# Patient Record
Sex: Female | Born: 1990
Health system: Southern US, Community
[De-identification: ages and names within clinical notes are randomized; demographics above are authoritative.]

## PROBLEM LIST (undated history)

## (undated) DIAGNOSIS — F419 Anxiety disorder, unspecified: Secondary | ICD-10-CM

## (undated) DIAGNOSIS — T7840XA Allergy, unspecified, initial encounter: Secondary | ICD-10-CM

## (undated) DIAGNOSIS — F32A Depression, unspecified: Secondary | ICD-10-CM

## (undated) HISTORY — DX: Depression, unspecified: F32.A

## (undated) HISTORY — DX: Anxiety disorder, unspecified: F41.9

## (undated) HISTORY — DX: Allergy, unspecified, initial encounter: T78.40XA

---

## 2011-05-02 ENCOUNTER — Encounter: Payer: Self-pay | Admitting: *Deleted

## 2011-05-02 ENCOUNTER — Emergency Department (HOSPITAL_COMMUNITY)
Admission: EM | Admit: 2011-05-02 | Discharge: 2011-05-02 | Disposition: A | Discharge status: Home / Self Care | Payer: Private Health Insurance - Indemnity | Attending: Emergency Medicine | Admitting: Emergency Medicine

## 2011-05-02 ENCOUNTER — Emergency Department (HOSPITAL_COMMUNITY): Payer: Private Health Insurance - Indemnity

## 2011-05-02 DIAGNOSIS — S0120XA Unspecified open wound of nose, initial encounter: Secondary | ICD-10-CM | POA: Insufficient documentation

## 2011-05-02 DIAGNOSIS — R51 Headache: Secondary | ICD-10-CM | POA: Insufficient documentation

## 2011-05-02 DIAGNOSIS — S0121XA Laceration without foreign body of nose, initial encounter: Secondary | ICD-10-CM

## 2011-05-02 DIAGNOSIS — Y92410 Unspecified street and highway as the place of occurrence of the external cause: Secondary | ICD-10-CM | POA: Insufficient documentation

## 2011-05-02 DIAGNOSIS — R040 Epistaxis: Secondary | ICD-10-CM | POA: Insufficient documentation

## 2011-05-02 MED ORDER — HYDROCODONE-ACETAMINOPHEN 5-325 MG PO TABS: 1.0000 | ORAL_TABLET | ORAL | Status: AC | PRN
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
## 2011-05-02 NOTE — ED Notes (Signed)
Discharged home with parents
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
## 2013-12-27 ENCOUNTER — Other Ambulatory Visit: Payer: Self-pay | Admitting: Family Medicine

## 2013-12-27 ENCOUNTER — Other Ambulatory Visit (HOSPITAL_COMMUNITY)
Admission: RE | Admit: 2013-12-27 | Discharge: 2013-12-27 | Disposition: A | Discharge status: Home / Self Care | Payer: Private Health Insurance - Indemnity | Source: Ambulatory Visit | Attending: Family Medicine | Admitting: Family Medicine

## 2013-12-27 DIAGNOSIS — Z0000 Encounter for general adult medical examination without abnormal findings: Secondary | ICD-10-CM | POA: Diagnosis present

## 2013-12-28 LAB — CYTOLOGY - PAP

## 2016-02-19 ENCOUNTER — Ambulatory Visit (HOSPITAL_COMMUNITY): Admission: RE | Admit: 2016-02-19 | Payer: Self-pay | Source: Home / Self Care | Admitting: Psychiatry

## 2016-07-17 DIAGNOSIS — Z79899 Other long term (current) drug therapy: Secondary | ICD-10-CM | POA: Diagnosis not present

## 2016-07-17 DIAGNOSIS — F3181 Bipolar II disorder: Secondary | ICD-10-CM | POA: Diagnosis not present

## 2016-08-18 DIAGNOSIS — H16223 Keratoconjunctivitis sicca, not specified as Sjogren's, bilateral: Secondary | ICD-10-CM | POA: Diagnosis not present

## 2016-12-21 ENCOUNTER — Emergency Department (HOSPITAL_COMMUNITY): Payer: Private Health Insurance - Indemnity

## 2016-12-21 ENCOUNTER — Emergency Department (HOSPITAL_COMMUNITY)
Admission: EM | Admit: 2016-12-21 | Discharge: 2016-12-21 | Disposition: A | Discharge status: Home / Self Care | Payer: Private Health Insurance - Indemnity | Attending: Emergency Medicine | Admitting: Emergency Medicine

## 2016-12-21 ENCOUNTER — Encounter (HOSPITAL_COMMUNITY): Payer: Self-pay | Admitting: Emergency Medicine

## 2016-12-21 DIAGNOSIS — S43402A Unspecified sprain of left shoulder joint, initial encounter: Secondary | ICD-10-CM | POA: Insufficient documentation

## 2016-12-21 DIAGNOSIS — Y990 Civilian activity done for income or pay: Secondary | ICD-10-CM | POA: Insufficient documentation

## 2016-12-21 DIAGNOSIS — Y93E1 Activity, personal bathing and showering: Secondary | ICD-10-CM | POA: Insufficient documentation

## 2016-12-21 DIAGNOSIS — Y929 Unspecified place or not applicable: Secondary | ICD-10-CM | POA: Insufficient documentation

## 2016-12-21 DIAGNOSIS — W182XXA Fall in (into) shower or empty bathtub, initial encounter: Secondary | ICD-10-CM | POA: Insufficient documentation

## 2016-12-21 DIAGNOSIS — Z79899 Other long term (current) drug therapy: Secondary | ICD-10-CM | POA: Insufficient documentation

## 2016-12-21 MED ORDER — FENTANYL CITRATE (PF) 100 MCG/2ML IJ SOLN
50.0000 ug | INTRAMUSCULAR | Status: DC | PRN
  Administered 2016-12-21: 50 ug via NASAL
  Filled 2016-12-21: qty 2

## 2016-12-21 NOTE — ED Provider Notes (Signed)
[facility]-EMERGENCY DEPT Provider Note   CSN: [id number] Arrival date & time: 12/21/16  1122     History   Chief Complaint Chief Complaint  Patient presents with  . Shoulder Injury    HPI Carolyn Ramirez is a 26 y.o. female.  25yo F w/ L shoulder pain after a fall. Earlier today, she slipped while in the shower and fell, striking her right arm and both knees. She reports that she only has mild pain in these areas but she has had left shoulder pain since the fall which is what brought her to the ED. She denies any head injury or loss of consciousness. No vomiting, visual changes, or problems walking. She is able to move her wrist and elbow, pain is mostly when she tries to raise her left arm. No numbness or tingling of her hands.   The history is provided by the patient.  Shoulder Injury     History reviewed. No pertinent past medical history.  There are no active problems to display for this patient.   History reviewed. No pertinent surgical history.  OB History    No data available       Home Medications    Prior to Admission medications   Medication Sig Start Date End Date Taking? Authorizing Provider  lamoTRIgine (LAMICTAL) 200 MG tablet Take 200 mg by mouth daily.     [provider]  LORazepam (ATIVAN) 0.5 MG tablet Take 0.5 mg by mouth every 8 (eight) hours as needed. anxiety    [provider]    Family History History reviewed. No pertinent family history.  Social History Social History  Substance Use Topics  . Smoking status: Not on file  . Smokeless tobacco: Not on file  . Alcohol use Not on file     Allergies   Amoxicillin; Chlorine; and Penicillins   Review of Systems Review of Systems  Eyes: Negative for visual disturbance.  Gastrointestinal: Negative for vomiting.  Musculoskeletal: Positive for arthralgias. Negative for gait problem.  Neurological: Negative for syncope, light-headedness and numbness.     Physical  Exam Updated Vital Signs BP 121/79 (BP Location: Right Arm)   Pulse 74   Temp 98.7 F (37.1 C) (Oral)   Resp 12   Ht 5\' 4"  (1.626 m)   Wt 110.7 kg (244 lb)   SpO2 96%   BMI 41.88 kg/m   Physical Exam  Constitutional: She is oriented to person, place, and time. She appears well-developed and well-nourished. No distress.  HENT:  Head: Normocephalic and atraumatic.  Eyes: Conjunctivae are normal.  Neck: Neck supple.  Cardiovascular: Intact distal pulses.   Musculoskeletal: She exhibits tenderness. She exhibits no deformity.  Normal ROM at left wrist and elbow, unable to abduct L shoulder 2/2 pain; tenderness of L lateral shoulder over deltoid muscle without obvious deformity; no tenderness of clavicle; no asymmetry compared to R shoulder  Neurological: She is alert and oriented to person, place, and time. No sensory deficit.  Skin: Skin is warm and dry.  No ecchymoses  Psychiatric: She has a normal mood and affect. Judgment normal.  Nursing note and vitals reviewed.    ED Treatments / Results  Labs (all labs ordered are listed, but only abnormal results are displayed) Labs Reviewed - No data to display  EKG  EKG Interpretation None       Radiology Dg Shoulder Left  Result Date: 12/21/2016 CLINICAL DATA:  26 year old female with shoulder pain post slipping in shower. Initial encounter. EXAM: LEFT  SHOULDER - 2+ VIEW COMPARISON:  None. FINDINGS: There is no evidence of fracture or dislocation. There is no evidence of arthropathy or other focal bone abnormality. Soft tissues are unremarkable. IMPRESSION: Negative. Electronically Signed   By: Lacy DuverneySteven  Olson M.D.   On: 12/21/2016 13:26    Procedures Procedures (including critical care time)  Medications Ordered in ED Medications  fentaNYL (SUBLIMAZE) injection 50 mcg (50 mcg Nasal Given 12/21/16 1242)     Initial Impression / Assessment and Plan / ED Course  I have reviewed the triage vital signs and the nursing  notes.  Pertinent imaging results that were available during my care of the patient were reviewed by me and considered in my medical decision making (see chart for details).    PT w/ L shoulder pain after a fall. Neurovascularly intact, no other injuries or complaints. Tender over deltoid muscle with no deformity or clavicular pain. XR negative. Discussed supportive measures including ice, NSAIDS, early ROM exercises. Provided with orthopedics follow-up information if her symptoms do not improve with supportive measures. Patient voiced understanding and was discharged in satisfactory condition.  Final Clinical Impressions(s) / ED Diagnoses   Final diagnoses:  Sprain of left shoulder, unspecified shoulder sprain type, initial encounter    New Prescriptions New Prescriptions   No medications on file     Saladin Petrelli, Ambrose Finlandachel Morgan, MD 12/21/16 1622

## 2016-12-21 NOTE — ED Notes (Signed)
No sling per EDP

## 2016-12-21 NOTE — ED Notes (Signed)
Pt has been provided with an ice pack.

## 2016-12-21 NOTE — ED Triage Notes (Signed)
Pt c/o left shoulder pain onset today after slipping in bathtub and falling, landing on knees and right arm. No head injury or LOC. Left shoulder minimally higher and shortened compared to right. 2+ radial pulse. Point tenderness to left shoulder. No pain with palpation or ROM to right shoulder, bilateral wrist, elbow, clavicle, knee, ankle.

## 2017-04-12 DIAGNOSIS — H43393 Other vitreous opacities, bilateral: Secondary | ICD-10-CM | POA: Diagnosis not present

## 2017-05-19 DIAGNOSIS — R112 Nausea with vomiting, unspecified: Secondary | ICD-10-CM | POA: Diagnosis not present

## 2017-09-29 DIAGNOSIS — H16223 Keratoconjunctivitis sicca, not specified as Sjogren's, bilateral: Secondary | ICD-10-CM | POA: Diagnosis not present

## 2017-10-18 DIAGNOSIS — Q839 Congenital malformation of breast, unspecified: Secondary | ICD-10-CM | POA: Diagnosis not present

## 2017-10-18 DIAGNOSIS — N6459 Other signs and symptoms in breast: Secondary | ICD-10-CM | POA: Diagnosis not present

## 2017-12-21 DIAGNOSIS — Z01419 Encounter for gynecological examination (general) (routine) without abnormal findings: Secondary | ICD-10-CM | POA: Diagnosis not present

## 2017-12-31 LAB — HM PAP SMEAR: HM Pap smear: NEGATIVE

## 2018-06-30 DIAGNOSIS — Z23 Encounter for immunization: Secondary | ICD-10-CM | POA: Diagnosis not present

## 2019-02-10 DIAGNOSIS — Z01411 Encounter for gynecological examination (general) (routine) with abnormal findings: Secondary | ICD-10-CM | POA: Diagnosis not present

## 2019-02-10 DIAGNOSIS — N920 Excessive and frequent menstruation with regular cycle: Secondary | ICD-10-CM | POA: Diagnosis not present

## 2019-02-22 DIAGNOSIS — Z3202 Encounter for pregnancy test, result negative: Secondary | ICD-10-CM | POA: Diagnosis not present

## 2019-02-22 DIAGNOSIS — Z30430 Encounter for insertion of intrauterine contraceptive device: Secondary | ICD-10-CM | POA: Diagnosis not present

## 2019-02-25 ENCOUNTER — Encounter (HOSPITAL_COMMUNITY): Payer: Self-pay | Admitting: *Deleted

## 2019-02-25 ENCOUNTER — Other Ambulatory Visit: Payer: Self-pay

## 2019-02-25 ENCOUNTER — Emergency Department (HOSPITAL_COMMUNITY)
Admission: EM | Admit: 2019-02-25 | Discharge: 2019-02-26 | Disposition: A | Discharge status: Home / Self Care | Payer: BC Managed Care – PPO | Attending: Emergency Medicine | Admitting: Emergency Medicine

## 2019-02-25 DIAGNOSIS — R1031 Right lower quadrant pain: Secondary | ICD-10-CM | POA: Diagnosis not present

## 2019-02-25 DIAGNOSIS — Z975 Presence of (intrauterine) contraceptive device: Secondary | ICD-10-CM | POA: Diagnosis not present

## 2019-02-25 DIAGNOSIS — N3090 Cystitis, unspecified without hematuria: Secondary | ICD-10-CM | POA: Insufficient documentation

## 2019-02-25 DIAGNOSIS — R110 Nausea: Secondary | ICD-10-CM | POA: Diagnosis not present

## 2019-02-25 DIAGNOSIS — R109 Unspecified abdominal pain: Secondary | ICD-10-CM | POA: Diagnosis not present

## 2019-02-25 LAB — URINALYSIS, ROUTINE W REFLEX MICROSCOPIC
Bilirubin Urine: NEGATIVE
Glucose, UA: NEGATIVE mg/dL
Ketones, ur: NEGATIVE mg/dL
Nitrite: NEGATIVE
Protein, ur: NEGATIVE mg/dL
Specific Gravity, Urine: 1.01 (ref 1.005–1.030)
pH: 5 (ref 5.0–8.0)

## 2019-02-25 LAB — I-STAT BETA HCG BLOOD, ED (MC, WL, AP ONLY): I-stat hCG, quantitative: <5 m[IU]/mL (ref <5)

## 2019-02-25 LAB — CBC
HCT: 38.5 % (ref 36.0–46.0)
Hemoglobin: 12.8 g/dL (ref 12.0–15.0)
MCH: 31.4 pg (ref 26.0–34.0)
MCHC: 33.2 g/dL (ref 30.0–36.0)
MCV: 94.6 fL (ref 80.0–100.0)
Platelets: 342 10*3/uL (ref 150–400)
RBC: 4.07 MIL/uL (ref 3.87–5.11)
RDW: 12.9 % (ref 11.5–15.5)
WBC: 8.9 10*3/uL (ref 4.0–10.5)
nRBC: 0 % (ref 0.0–0.2)

## 2019-02-25 MED ORDER — SODIUM CHLORIDE 0.9% FLUSH
3.0000 mL | Freq: Once | INTRAVENOUS | Status: AC
  Administered 2019-02-26: 3 mL via INTRAVENOUS

## 2019-02-25 NOTE — ED Triage Notes (Signed)
Pt reporting right lower abdominal pain that started several hours ago, associated with nausea. No urinary or vaginal symptoms. Pt did have an IUD placed on Wednesday.

## 2019-02-26 ENCOUNTER — Emergency Department (HOSPITAL_COMMUNITY): Payer: BC Managed Care – PPO

## 2019-02-26 ENCOUNTER — Encounter (HOSPITAL_COMMUNITY): Payer: Self-pay

## 2019-02-26 LAB — COMPREHENSIVE METABOLIC PANEL
ALT: 19 U/L (ref 0–44)
AST: 19 U/L (ref 15–41)
Albumin: 4.3 g/dL (ref 3.5–5.0)
Alkaline Phosphatase: 59 U/L (ref 38–126)
Anion gap: 10 (ref 5–15)
BUN: 10 mg/dL (ref 6–20)
CO2: 21 mmol/L — ABNORMAL LOW (ref 22–32)
Calcium: 9.4 mg/dL (ref 8.9–10.3)
Chloride: 107 mmol/L (ref 98–111)
Creatinine, Ser: 0.77 mg/dL (ref 0.44–1.00)
GFR calc Af Amer: >60 mL/min (ref >60)
GFR calc non Af Amer: >60 mL/min (ref >60)
Glucose, Bld: 88 mg/dL (ref 70–99)
Potassium: 4 mmol/L (ref 3.5–5.1)
Sodium: 138 mmol/L (ref 135–145)
Total Bilirubin: 0.4 mg/dL (ref 0.3–1.2)
Total Protein: 7.5 g/dL (ref 6.5–8.1)

## 2019-02-26 LAB — LIPASE, BLOOD: Lipase: 27 U/L (ref 11–51)

## 2019-02-26 MED ORDER — ONDANSETRON 4 MG PO TBDP: 4.0000 mg | ORAL_TABLET | Freq: Three times a day (TID) | ORAL | 0 refills | Status: DC | PRN

## 2019-02-26 MED ORDER — SODIUM CHLORIDE (PF) 0.9 % IJ SOLN
INTRAMUSCULAR | Status: AC
  Filled 2019-02-26: qty 50

## 2019-02-26 MED ORDER — CIPROFLOXACIN HCL 500 MG PO TABS
500.0000 mg | ORAL_TABLET | Freq: Once | ORAL | Status: AC
  Administered 2019-02-26: 500 mg via ORAL
  Filled 2019-02-26: qty 1

## 2019-02-26 MED ORDER — ONDANSETRON HCL 4 MG/2ML IJ SOLN
4.0000 mg | Freq: Once | INTRAMUSCULAR | Status: AC
  Administered 2019-02-26: 4 mg via INTRAVENOUS
  Filled 2019-02-26: qty 2

## 2019-02-26 MED ORDER — CIPROFLOXACIN HCL 500 MG PO TABS: 500.0000 mg | ORAL_TABLET | Freq: Two times a day (BID) | ORAL | 0 refills | Status: DC

## 2019-02-26 MED ORDER — IOHEXOL 300 MG/ML  SOLN
100.0000 mL | Freq: Once | INTRAMUSCULAR | Status: AC | PRN
  Administered 2019-02-26: 100 mL via INTRAVENOUS

## 2019-02-26 MED ORDER — MORPHINE SULFATE (PF) 4 MG/ML IV SOLN
4.0000 mg | Freq: Once | INTRAVENOUS | Status: AC
  Administered 2019-02-26: 4 mg via INTRAVENOUS
  Filled 2019-02-26: qty 1

## 2019-02-26 NOTE — ED Provider Notes (Signed)
[facility]-EMERGENCY DEPT Provider Note   CSN: [id number] Arrival date & time: 02/25/19  2233     History   Chief Complaint Chief Complaint  Patient presents with   Abdominal Pain    HPI Carolyn Ramirez is a 28 y.o. female.     Patient presents to the emergency department with a chief complaint of right lower quadrant abdominal pain.  She states symptoms started this morning and then gradually worsened.  She reports associated nausea with no vomiting.  She denies any fevers or chills.  She states that she had an IUD placed about 3 or 4 days ago and initially had some cramping sensation afterward, but states that her current pain does not feel like menstrual cramps.  She denies any dysuria or hematuria.  She states that she did have some spotting after the IUD was placed, but this is resolved.  She states that this feels unlike anything she has experienced before.  The history is provided by the patient. No language interpreter was used.    History reviewed. No pertinent past medical history.  There are no active problems to display for this patient.   History reviewed. No pertinent surgical history.   OB History   No obstetric history on file.      Home Medications    Prior to Admission medications   Medication Sig Start Date End Date Taking? Authorizing Provider  lamoTRIgine (LAMICTAL) 200 MG tablet Take 200 mg by mouth daily.     [provider]  LORazepam (ATIVAN) 0.5 MG tablet Take 0.5 mg by mouth every 8 (eight) hours as needed. anxiety    [provider]    Family History No family history on file.  Social History Social History   Tobacco Use   Smoking status: Not on file  Substance Use Topics   Alcohol use: Not on file   Drug use: Not on file     Allergies   Amoxicillin, Chlorine, and Penicillins   Review of Systems Review of Systems  All other systems reviewed and are negative.    Physical  Exam Updated Vital Signs BP (!) 142/89 (BP Location: Left Arm)    Pulse 94    Temp 98.1 F (36.7 C) (Oral)    Resp 18    LMP 02/19/2019    SpO2 94%   Physical Exam Vitals signs and nursing note reviewed.  Constitutional:      General: She is not in acute distress.    Appearance: She is well-developed.  HENT:     Head: Normocephalic and atraumatic.  Eyes:     Conjunctiva/sclera: Conjunctivae normal.  Neck:     Musculoskeletal: Neck supple.  Cardiovascular:     Rate and Rhythm: Normal rate and regular rhythm.     Heart sounds: No murmur.  Pulmonary:     Effort: Pulmonary effort is normal. No respiratory distress.     Breath sounds: Normal breath sounds.  Abdominal:     Palpations: Abdomen is soft.     Tenderness: There is abdominal tenderness in the right lower quadrant.  Skin:    General: Skin is warm and dry.  Neurological:     Mental Status: She is alert.      ED Treatments / Results  Labs (all labs ordered are listed, but only abnormal results are displayed) Labs Reviewed  COMPREHENSIVE METABOLIC PANEL - Abnormal; Notable for the following components:      Result Value   CO2 21 (*)  All other components within normal limits  URINALYSIS, ROUTINE W REFLEX MICROSCOPIC - Abnormal; Notable for the following components:   Hgb urine dipstick MODERATE (*)    Leukocytes,Ua SMALL (*)    Bacteria, UA FEW (*)    All other components within normal limits  LIPASE, BLOOD  CBC  I-STAT BETA HCG BLOOD, ED ([facility], [facility], [facility] ONLY)    EKG None  Radiology Ct Abdomen Pelvis W Contrast  Result Date: 02/26/2019 CLINICAL DATA:  Abdominal pain, appendicitis suspected, right lower abdominal pain began several hours prior with associated nausea. IUD placement on Wednesday. EXAM: CT ABDOMEN AND PELVIS WITH CONTRAST TECHNIQUE: Multidetector CT imaging of the abdomen and pelvis was performed using the standard protocol following bolus administration of intravenous contrast. CONTRAST:  176mL  OMNIPAQUE IOHEXOL 300 MG/ML  SOLN COMPARISON:  None. FINDINGS: Lower chest: Lung bases are clear. Normal heart size. No pericardial effusion. Hepatobiliary: No focal liver abnormality is seen. No gallstones, gallbladder wall thickening, or biliary dilatation. Pancreas: Unremarkable. No pancreatic ductal dilatation or surrounding inflammatory changes. Spleen: Normal in size without focal abnormality. Adrenals/Urinary Tract: Adrenal glands are unremarkable. Kidneys are normal, without renal calculi, focal lesion, or hydronephrosis. Mild bladder wall thickening greater than expected for the degree of distention. Stomach/Bowel: Distal esophagus, stomach and duodenal sweep are unremarkable. No small bowel wall thickening or dilatation. No evidence of obstruction. A normal appendix is visualized. No colonic dilatation or wall thickening. Vascular/Lymphatic: The aorta is normal caliber. No suspicious or enlarged lymph nodes in the included lymphatic chains. Reproductive: Anteverted uterus with a normally positioned IUD. No concerning adnexal lesions. Functional ovarian cyst in the right adnexa. Small 8 mm cystic structure approximating the external urethral meatus, inferior to the pubic symphysis. Other: No abdominopelvic free fluid or free gas. No bowel containing hernias. Musculoskeletal: No acute osseous abnormality or suspicious osseous lesion. IMPRESSION: 1. Normal appendix without evidence of acute appendicitis. 2. Mild bladder wall thickening greater than expected for the degree of distention. Correlate with urinalysis to exclude cystitis. 3. Small 8 mm cystic structure approximating the external urethral meatus, inferior to the pubic symphysis, could reflect a Skene duct cyst or small urethral diverticulum. Electronically Signed   By: Lovena Le M.D.   On: 02/26/2019 02:58    Procedures Procedures (including critical care time)  Medications Ordered in ED Medications  sodium chloride flush (NS) 0.9 %  injection 3 mL (3 mLs Intravenous Given 02/26/19 0048)  morphine 4 MG/ML injection 4 mg (4 mg Intravenous Given 02/26/19 0049)  ondansetron (ZOFRAN) injection 4 mg (4 mg Intravenous Given 02/26/19 0049)     Initial Impression / Assessment and Plan / ED Course  I have reviewed the triage vital signs and the nursing notes.  Pertinent labs & imaging results that were available during my care of the patient were reviewed by me and considered in my medical decision making (see chart for details).        Patient with significant right lower quadrant tenderness to palpation, the tenderness is in the abdomen, and less in the pelvis.  I doubt ovarian or uterine etiology.  I do feel that it is necessary to rule out appendicitis based on the location and the amount of tenderness.  Will check CT abdomen pelvis.  CT shows normal appendix without any evidence of appendicitis.  There is some bladder wall thickening, which in conjunction with her urinalysis, could be significant for cystitis.  Will treat with Cipro as she has anaphylactic reaction to penicillins.  Also discussed the cystic structure seen on CT and have advised her to follow-up with urology.  Patient understands and agrees with plan.  She is stable and ready for discharge.  Return precautions discussed.  Final Clinical Impressions(s) / ED Diagnoses   Final diagnoses:  Cystitis    ED Discharge Orders         Ordered    ciprofloxacin (CIPRO) 500 MG tablet  2 times daily     02/26/19 0336    ondansetron (ZOFRAN ODT) 4 MG disintegrating tablet  Every 8 hours PRN     02/26/19 0336           Roxy HorsemanBrowning, Torey Regan, PA-C 02/26/19 [id number]    Nira Connardama, Pedro Eduardo, MD 03/01/19 2109

## 2019-02-26 NOTE — Discharge Instructions (Signed)
Your CT scan showed no evidence of appendicitis.  You did have findings both on the CT scan and in your urinalysis that are suggestive of bladder infection.  Additionally, there was a

## 2019-02-27 LAB — URINE CULTURE: Culture: <10000 — AB

## 2019-02-28 DIAGNOSIS — N3000 Acute cystitis without hematuria: Secondary | ICD-10-CM | POA: Diagnosis not present

## 2019-02-28 DIAGNOSIS — R8271 Bacteriuria: Secondary | ICD-10-CM | POA: Diagnosis not present

## 2019-02-28 DIAGNOSIS — R9341 Abnormal radiologic findings on diagnostic imaging of renal pelvis, ureter, or bladder: Secondary | ICD-10-CM | POA: Diagnosis not present

## 2019-02-28 DIAGNOSIS — R1031 Right lower quadrant pain: Secondary | ICD-10-CM | POA: Diagnosis not present

## 2019-04-06 DIAGNOSIS — N920 Excessive and frequent menstruation with regular cycle: Secondary | ICD-10-CM | POA: Diagnosis not present

## 2019-04-06 DIAGNOSIS — Z30431 Encounter for routine checking of intrauterine contraceptive device: Secondary | ICD-10-CM | POA: Diagnosis not present

## 2021-04-11 IMAGING — CT CT ABD-PELV W/ CM
2 of 4 series · 16 of 46 positions shown, 18 images · IV contrast (omnipaque)
Comparison: None.

CLINICAL DATA: Abdominal pain, appendicitis suspected, right lower
abdominal pain began several hours prior with associated nausea. IUD
placement on [REDACTED].

EXAM:
CT ABDOMEN AND PELVIS WITH CONTRAST
TECHNIQUE: Multidetector CT imaging of the abdomen and pelvis was performed
using the standard protocol following bolus administration of
intravenous contrast.
CONTRAST:  100mL OMNIPAQUE IOHEXOL 300 MG/ML  SOLN

[Series 2: axial st · axial · 0.70mm/px · z∈[-511,-61]mm · 13 of 102 slices shown, 15 images]
[im 6/102  soft-tissue]
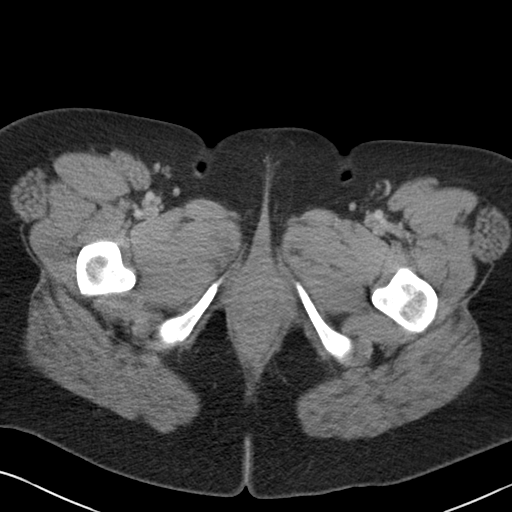
[im 6/102  bone]
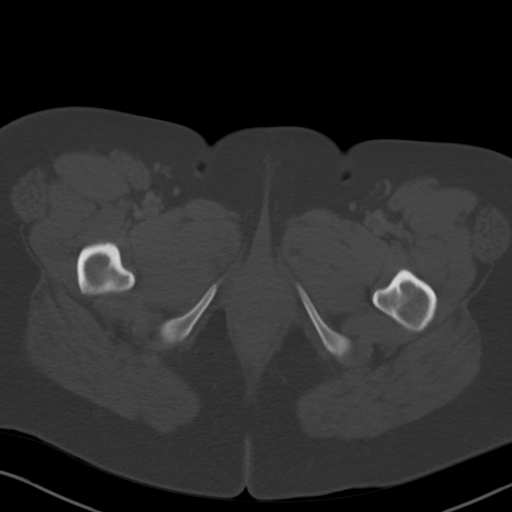
[im 12/102  soft-tissue]
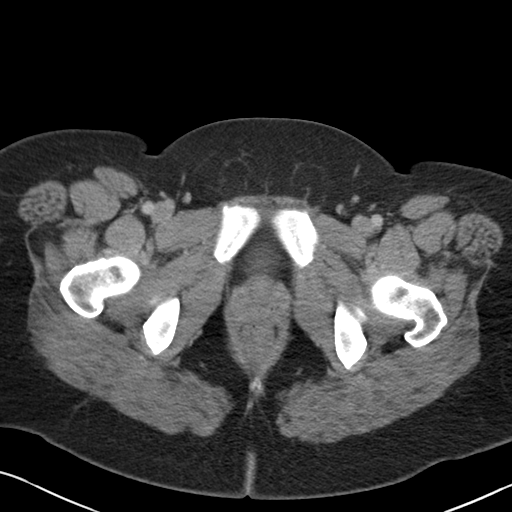
[im 23/102  soft-tissue]
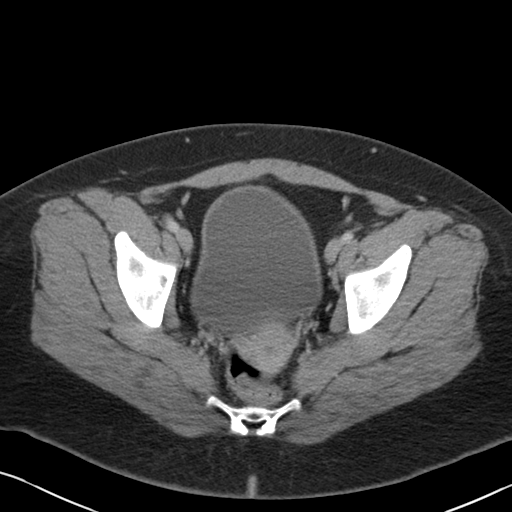
[im 29/102  soft-tissue]
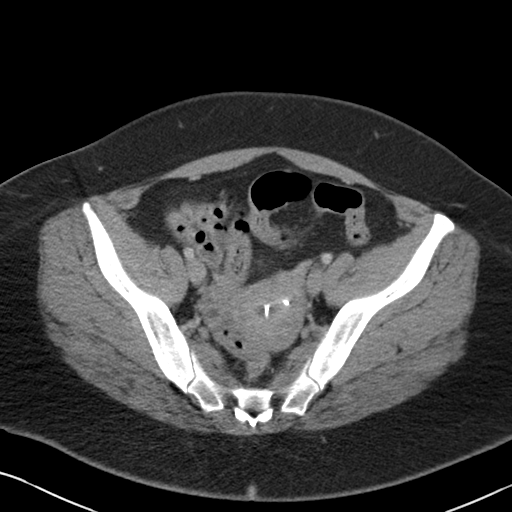
[im 34/102  soft-tissue]
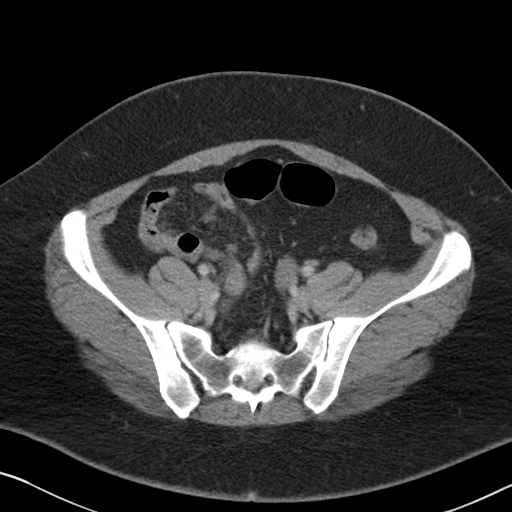
[im 45/102  soft-tissue]
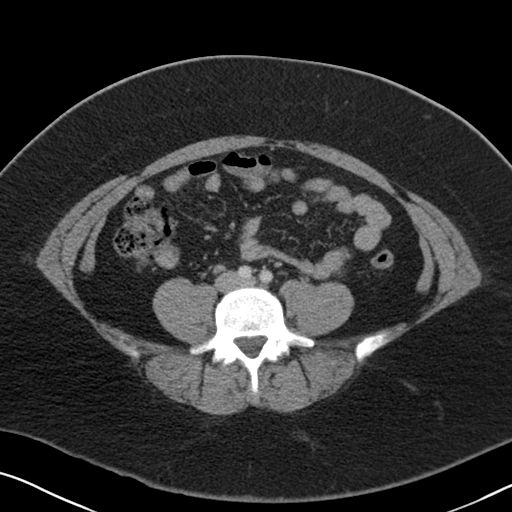
[im 51/102  soft-tissue]
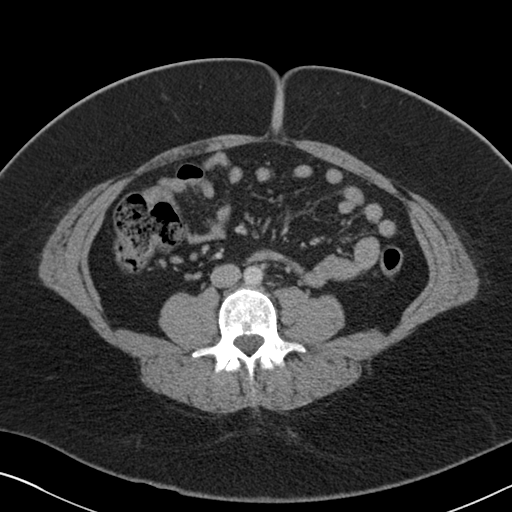
[im 57/102  soft-tissue]
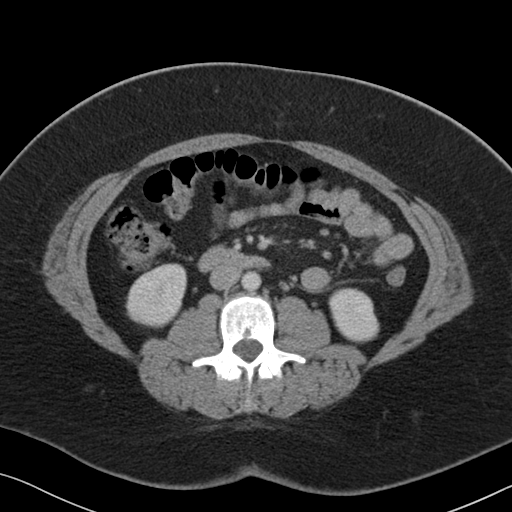
[im 68/102  soft-tissue]
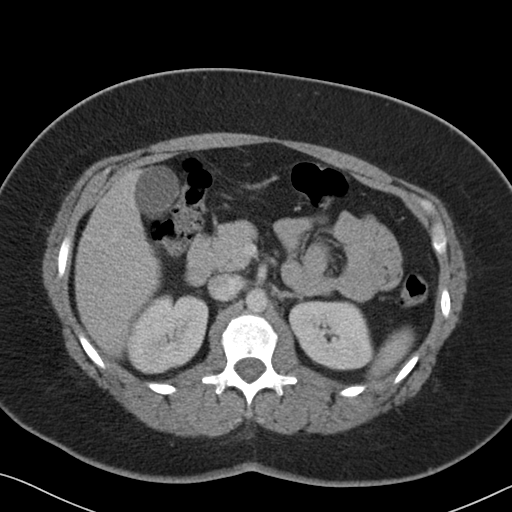
[im 68/102  bone]
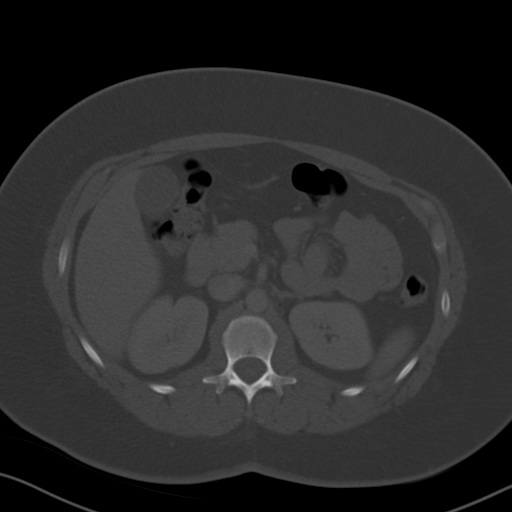
[im 73/102  soft-tissue]
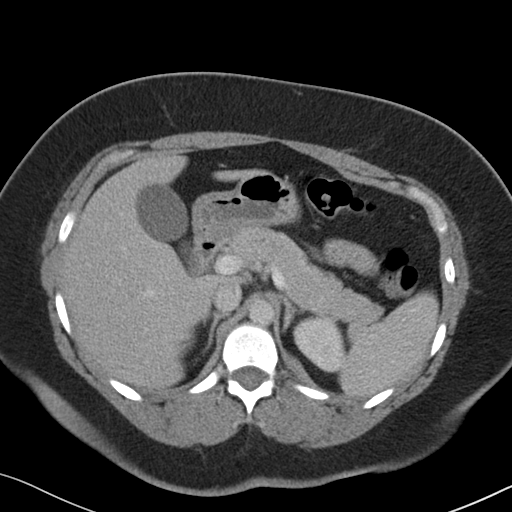
[im 79/102  soft-tissue]
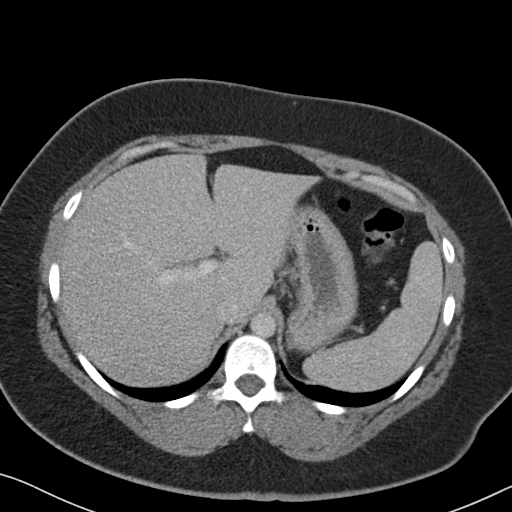
[im 90/102  soft-tissue]
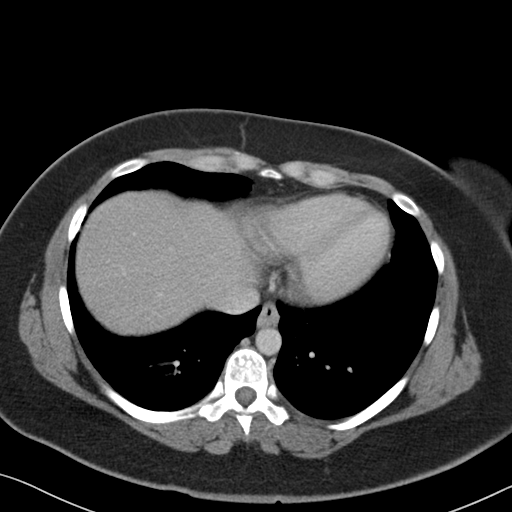
[im 96/102  soft-tissue]
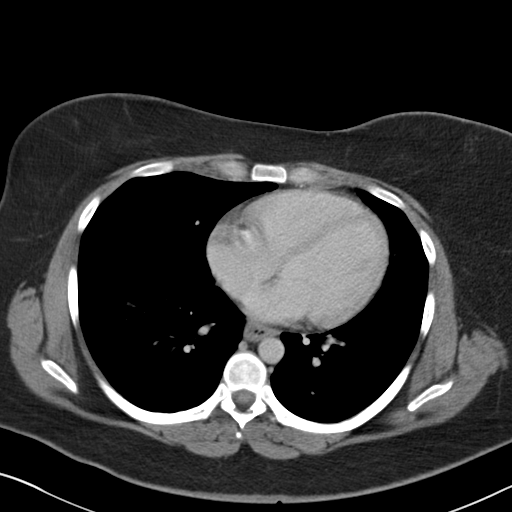

[Series 5: coronal st · coronal · 0.84mm/px · 3 of 151 slices shown]
[im 51/151  soft-tissue]
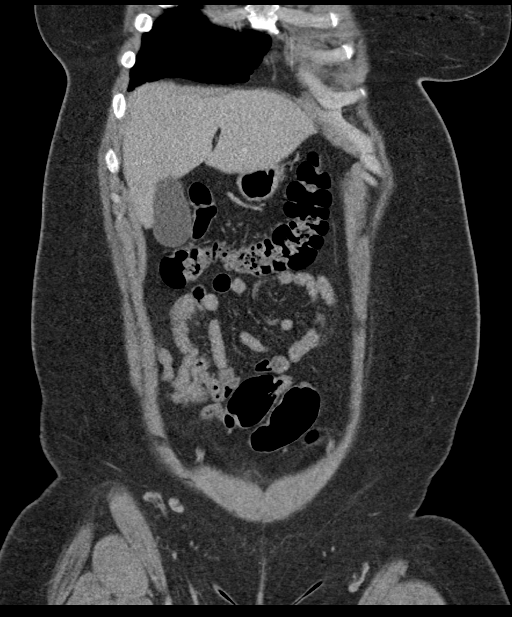
[im 67/151  soft-tissue]
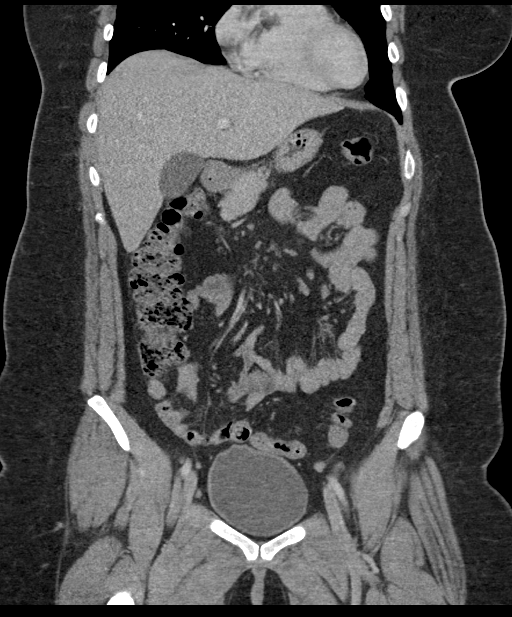
[im 84/151  soft-tissue]
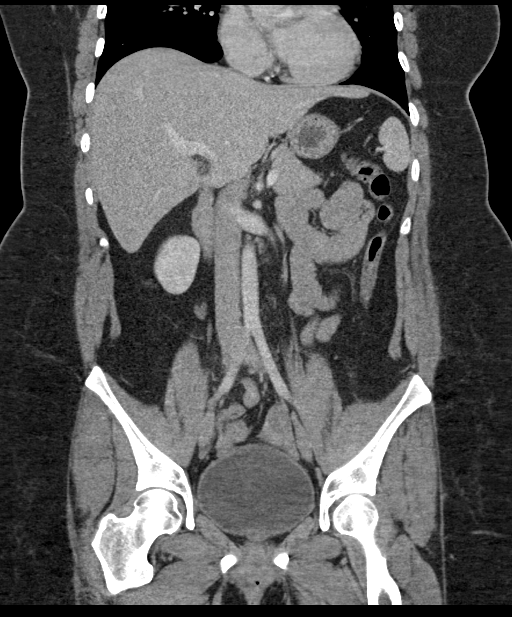

[16 of 46 positions shown; findings below may reference images not displayed]

FINDINGS: Lower chest: Lung bases are clear. Normal heart size. No pericardial
effusion.

Hepatobiliary: No focal liver abnormality is seen. No gallstones,
gallbladder wall thickening, or biliary dilatation.

Pancreas: Unremarkable. No pancreatic ductal dilatation or
surrounding inflammatory changes.

Spleen: Normal in size without focal abnormality.

Adrenals/Urinary Tract: Adrenal glands are unremarkable. Kidneys are
normal, without renal calculi, focal lesion, or hydronephrosis. Mild
bladder wall thickening greater than expected for the degree of
distention.

Stomach/Bowel: Distal esophagus, stomach and duodenal sweep are
unremarkable. No small bowel wall thickening or dilatation. No
evidence of obstruction. A normal appendix is visualized. No colonic
dilatation or wall thickening.

Vascular/Lymphatic: The aorta is normal caliber. No suspicious or
enlarged lymph nodes in the included lymphatic chains.

Reproductive: Anteverted uterus with a normally positioned IUD. No
concerning adnexal lesions. Functional ovarian cyst in the right
adnexa. Small 8 mm cystic structure approximating the external
urethral meatus, inferior to the pubic symphysis.

Other: No abdominopelvic free fluid or free gas. No bowel containing
hernias.

Musculoskeletal: No acute osseous abnormality or suspicious osseous
lesion.
IMPRESSION: 1. Normal appendix without evidence of acute appendicitis.
2. Mild bladder wall thickening greater than expected for the degree
of distention. Correlate with urinalysis to exclude cystitis.
3. Small 8 mm cystic structure approximating the external urethral
meatus, inferior to the pubic symphysis, could reflect Felner Ceola
cyst or small urethral diverticulum.

## 2022-06-18 DIAGNOSIS — Z419 Encounter for procedure for purposes other than remedying health state, unspecified: Secondary | ICD-10-CM | POA: Diagnosis not present

## 2022-07-17 DIAGNOSIS — Z419 Encounter for procedure for purposes other than remedying health state, unspecified: Secondary | ICD-10-CM | POA: Diagnosis not present

## 2022-08-17 DIAGNOSIS — Z419 Encounter for procedure for purposes other than remedying health state, unspecified: Secondary | ICD-10-CM | POA: Diagnosis not present

## 2022-09-16 DIAGNOSIS — Z419 Encounter for procedure for purposes other than remedying health state, unspecified: Secondary | ICD-10-CM | POA: Diagnosis not present

## 2022-09-17 ENCOUNTER — Other Ambulatory Visit: Payer: Self-pay

## 2022-09-17 ENCOUNTER — Emergency Department (HOSPITAL_BASED_OUTPATIENT_CLINIC_OR_DEPARTMENT_OTHER)
Admission: EM | Admit: 2022-09-17 | Discharge: 2022-09-17 | Disposition: A | Discharge status: Home / Self Care | Payer: Medicaid Other | Attending: Emergency Medicine | Admitting: Emergency Medicine

## 2022-09-17 ENCOUNTER — Encounter (HOSPITAL_BASED_OUTPATIENT_CLINIC_OR_DEPARTMENT_OTHER): Payer: Self-pay | Admitting: *Deleted

## 2022-09-17 DIAGNOSIS — S0185XA Open bite of other part of head, initial encounter: Secondary | ICD-10-CM | POA: Diagnosis not present

## 2022-09-17 DIAGNOSIS — W5501XA Bitten by cat, initial encounter: Secondary | ICD-10-CM | POA: Insufficient documentation

## 2022-09-17 DIAGNOSIS — Z23 Encounter for immunization: Secondary | ICD-10-CM | POA: Insufficient documentation

## 2022-09-17 DIAGNOSIS — T148XXA Other injury of unspecified body region, initial encounter: Secondary | ICD-10-CM

## 2022-09-17 MED ORDER — RABIES VACCINE, PCEC IM SUSR
1.0000 mL | Freq: Once | INTRAMUSCULAR | Status: AC
  Administered 2022-09-17: 1 mL via INTRAMUSCULAR
  Filled 2022-09-17: qty 1

## 2022-09-17 MED ORDER — RABIES IMMUNE GLOBULIN 150 UNIT/ML IM INJ
20.0000 [IU]/kg | INJECTION | Freq: Once | INTRAMUSCULAR | Status: AC
  Administered 2022-09-17: 2100 [IU]
  Filled 2022-09-17: qty 14

## 2022-09-17 NOTE — Discharge Instructions (Addendum)
Please return to an urgent care or your primary care physician on day 3, 7, 14 for the next doses of the vaccination.  Take Tylenol/ibuprofen for pain.  Return to the ER if new or worsening symptoms were discussed become apparent.

## 2022-09-17 NOTE — ED Provider Notes (Signed)
[place] EMERGENCY DEPARTMENT AT [facility] Provider Note   CSN: [id number] Arrival date & time: 09/17/22  1731     History  Chief Complaint  Patient presents with   Animal Bite    Carolyn Ramirez is a 32 y.o. female otherwise healthy presents today after being bit by a cat.  Patient has bite marks under her chin.  Bleeding has been controlled.  Patient went to urgent care and was prescribed antibiotics.  She was told to come to the ER for rabies prophylaxis.  No fever, nausea, vomiting tremor, neck stiffness, chest pain, shortness of breath.   Animal Bite    History reviewed. No pertinent past medical history.   Home Medications Prior to Admission medications   Medication Sig Start Date End Date Taking? Authorizing Provider  ciprofloxacin (CIPRO) 500 MG tablet Take 1 tablet (500 mg total) by mouth 2 (two) times daily. 02/26/19   Roxy Horseman, PA-C  lamoTRIgine (LAMICTAL) 200 MG tablet Take 200 mg by mouth daily.     [provider]  LORazepam (ATIVAN) 0.5 MG tablet Take 0.5 mg by mouth every 8 (eight) hours as needed. anxiety    [provider]  ondansetron (ZOFRAN ODT) 4 MG disintegrating tablet Take 1 tablet (4 mg total) by mouth every 8 (eight) hours as needed. 02/26/19   Roxy Horseman, PA-C      Allergies    Amoxicillin, Chlorine, and Penicillins    Review of Systems   Review of Systems Negative except as per HPI.  Physical Exam Updated Vital Signs BP (!) 118/96   Pulse 99   Temp 98.5 F (36.9 C) (Oral)   Resp 16   SpO2 100%  Physical Exam Vitals and nursing note reviewed.  Constitutional:      Appearance: Normal appearance.  HENT:     Head: Normocephalic and atraumatic.     Mouth/Throat:     Mouth: Mucous membranes are moist.  Eyes:     General: No scleral icterus. Cardiovascular:     Rate and Rhythm: Normal rate and regular rhythm.     Pulses: Normal pulses.     Heart sounds: Normal heart sounds.  Pulmonary:      Effort: Pulmonary effort is normal.     Breath sounds: Normal breath sounds.  Abdominal:     General: Abdomen is flat.     Palpations: Abdomen is soft.     Tenderness: There is no abdominal tenderness.  Musculoskeletal:        General: No deformity.  Skin:    General: Skin is warm.     Findings: No rash.     Comments: Bite marks under the chin.  Bleeding is controlled.  No pus or fluid drainage noted.  Neurological:     General: No focal deficit present.     Mental Status: She is alert.  Psychiatric:        Mood and Affect: Mood normal.     ED Results / Procedures / Treatments   Labs (all labs ordered are listed, but only abnormal results are displayed) Labs Reviewed - No data to display  EKG None  Radiology No results found.  Procedures Procedures    Medications Ordered in ED Medications  rabies immune globulin (HYPERRAB/KEDRAB) injection 20 Units/kg (has no administration in time range)  rabies vaccine (RABAVERT) injection 1 mL (has no administration in time range)    ED Course/ Medical Decision Making/ A&P  Medical Decision Making Risk Prescription drug management.   32 year old female presents after being bitten by a cat.  She was evaluated in urgent care and antibiotics has been prescribed.  She came to the ER for rabies prophylaxis.  Given immunoglobulin and first dose of rabies vaccine here.  Advised patient to return to her primary care physician or an urgent care on day 3, 7, 14 for the next 3 doses of the vaccination.  Advised patient to take her antibiotics as prescribed.  Strict return precaution discussed.        Final Clinical Impression(s) / ED Diagnoses Final diagnoses:  Animal bite    Rx / DC Orders ED Discharge Orders     None         Jeanelle Malling, [place] 09/18/22 2137    Tegeler, Canary Brim, MD 09/21/22 [phone]

## 2022-09-17 NOTE — ED Triage Notes (Signed)
Pt was bitten by a cat on her chin.  Vaccination status of cat is unknown. Last tetanus 2020

## 2022-09-20 ENCOUNTER — Ambulatory Visit
Admission: EM | Admit: 2022-09-20 | Discharge: 2022-09-20 | Disposition: A | Discharge status: Home / Self Care | Payer: Medicaid Other | Attending: Physician Assistant | Admitting: Physician Assistant

## 2022-09-20 DIAGNOSIS — Z203 Contact with and (suspected) exposure to rabies: Secondary | ICD-10-CM | POA: Diagnosis not present

## 2022-09-20 DIAGNOSIS — Z23 Encounter for immunization: Secondary | ICD-10-CM

## 2022-09-20 MED ORDER — RABIES VACCINE, PCEC IM SUSR
1.0000 mL | Freq: Once | INTRAMUSCULAR | Status: AC
  Administered 2022-09-20: 1 mL via INTRAMUSCULAR

## 2022-09-20 NOTE — ED Triage Notes (Signed)
Pt presents for rabies injection. No concerns.

## 2022-09-24 ENCOUNTER — Ambulatory Visit
Admission: RE | Admit: 2022-09-24 | Discharge: 2022-09-24 | Disposition: A | Discharge status: Home / Self Care | Payer: Medicaid Other | Source: Ambulatory Visit | Attending: Internal Medicine | Admitting: Internal Medicine

## 2022-09-24 DIAGNOSIS — Z203 Contact with and (suspected) exposure to rabies: Secondary | ICD-10-CM | POA: Diagnosis not present

## 2022-09-24 MED ORDER — RABIES VACCINE, PCEC IM SUSR
1.0000 mL | Freq: Once | INTRAMUSCULAR | Status: AC
  Administered 2022-09-24: 1 mL via INTRAMUSCULAR

## 2022-09-24 NOTE — ED Triage Notes (Signed)
Pt here for day 7 rabies vaccine. Denies concerns at this time.

## 2022-10-01 ENCOUNTER — Ambulatory Visit
Admission: RE | Admit: 2022-10-01 | Discharge: 2022-10-01 | Disposition: A | Discharge status: Home / Self Care | Payer: Medicaid Other | Source: Ambulatory Visit | Attending: Urgent Care | Admitting: Urgent Care

## 2022-10-01 DIAGNOSIS — Z23 Encounter for immunization: Secondary | ICD-10-CM | POA: Diagnosis not present

## 2022-10-01 DIAGNOSIS — Z203 Contact with and (suspected) exposure to rabies: Secondary | ICD-10-CM

## 2022-10-01 MED ORDER — RABIES VACCINE, PCEC IM SUSR
1.0000 mL | Freq: Once | INTRAMUSCULAR | Status: AC
  Administered 2022-10-01: 1 mL via INTRAMUSCULAR

## 2022-10-17 DIAGNOSIS — Z419 Encounter for procedure for purposes other than remedying health state, unspecified: Secondary | ICD-10-CM | POA: Diagnosis not present

## 2022-11-16 DIAGNOSIS — Z419 Encounter for procedure for purposes other than remedying health state, unspecified: Secondary | ICD-10-CM | POA: Diagnosis not present

## 2022-12-17 DIAGNOSIS — Z419 Encounter for procedure for purposes other than remedying health state, unspecified: Secondary | ICD-10-CM | POA: Diagnosis not present

## 2023-01-17 DIAGNOSIS — Z419 Encounter for procedure for purposes other than remedying health state, unspecified: Secondary | ICD-10-CM | POA: Diagnosis not present

## 2023-02-16 DIAGNOSIS — Z419 Encounter for procedure for purposes other than remedying health state, unspecified: Secondary | ICD-10-CM | POA: Diagnosis not present

## 2023-03-19 DIAGNOSIS — Z419 Encounter for procedure for purposes other than remedying health state, unspecified: Secondary | ICD-10-CM | POA: Diagnosis not present

## 2023-04-18 DIAGNOSIS — Z419 Encounter for procedure for purposes other than remedying health state, unspecified: Secondary | ICD-10-CM | POA: Diagnosis not present

## 2023-05-19 DIAGNOSIS — Z419 Encounter for procedure for purposes other than remedying health state, unspecified: Secondary | ICD-10-CM | POA: Diagnosis not present

## 2023-06-19 DIAGNOSIS — Z419 Encounter for procedure for purposes other than remedying health state, unspecified: Secondary | ICD-10-CM | POA: Diagnosis not present

## 2023-07-17 DIAGNOSIS — Z419 Encounter for procedure for purposes other than remedying health state, unspecified: Secondary | ICD-10-CM | POA: Diagnosis not present

## 2023-08-28 DIAGNOSIS — Z419 Encounter for procedure for purposes other than remedying health state, unspecified: Secondary | ICD-10-CM | POA: Diagnosis not present

## 2023-09-27 DIAGNOSIS — Z419 Encounter for procedure for purposes other than remedying health state, unspecified: Secondary | ICD-10-CM | POA: Diagnosis not present

## 2023-10-28 DIAGNOSIS — Z419 Encounter for procedure for purposes other than remedying health state, unspecified: Secondary | ICD-10-CM | POA: Diagnosis not present

## 2023-11-27 DIAGNOSIS — Z419 Encounter for procedure for purposes other than remedying health state, unspecified: Secondary | ICD-10-CM | POA: Diagnosis not present

## 2023-12-28 DIAGNOSIS — Z419 Encounter for procedure for purposes other than remedying health state, unspecified: Secondary | ICD-10-CM | POA: Diagnosis not present

## 2024-01-18 ENCOUNTER — Ambulatory Visit: Admitting: Family Medicine

## 2024-01-21 ENCOUNTER — Ambulatory Visit: Payer: Self-pay | Admitting: Family Medicine

## 2024-01-21 ENCOUNTER — Encounter: Payer: Self-pay | Admitting: Family Medicine

## 2024-01-21 ENCOUNTER — Ambulatory Visit (INDEPENDENT_AMBULATORY_CARE_PROVIDER_SITE_OTHER): Admitting: Family Medicine

## 2024-01-21 ENCOUNTER — Other Ambulatory Visit: Payer: Self-pay | Admitting: Medical Genetics

## 2024-01-21 VITALS — BP 116/82 | HR 85 | Temp 98.2°F | Ht 64.0 in | Wt 242.0 lb

## 2024-01-21 DIAGNOSIS — Z23 Encounter for immunization: Secondary | ICD-10-CM

## 2024-01-21 DIAGNOSIS — Z6841 Body Mass Index (BMI) 40.0 and over, adult: Secondary | ICD-10-CM | POA: Diagnosis not present

## 2024-01-21 DIAGNOSIS — Z7689 Persons encountering health services in other specified circumstances: Secondary | ICD-10-CM

## 2024-01-21 DIAGNOSIS — E66813 Obesity, class 3: Secondary | ICD-10-CM

## 2024-01-21 LAB — COMPREHENSIVE METABOLIC PANEL WITH GFR
ALT: 15 U/L (ref 0–35)
AST: 18 U/L (ref 0–37)
Albumin: 4.2 g/dL (ref 3.5–5.2)
Alkaline Phosphatase: 56 U/L (ref 39–117)
BUN: 15 mg/dL (ref 6–23)
CO2: 25 meq/L (ref 19–32)
Calcium: 9.4 mg/dL (ref 8.4–10.5)
Chloride: 105 meq/L (ref 96–112)
Creatinine, Ser: 0.84 mg/dL (ref 0.40–1.20)
GFR: 90.43 mL/min (ref >60.00)
Glucose, Bld: 92 mg/dL (ref 70–99)
Potassium: 4.6 meq/L (ref 3.5–5.1)
Sodium: 138 meq/L (ref 135–145)
Total Bilirubin: 0.4 mg/dL (ref 0.2–1.2)
Total Protein: 7 g/dL (ref 6.0–8.3)

## 2024-01-21 LAB — LIPID PANEL
Cholesterol: 227 mg/dL — ABNORMAL HIGH (ref 0–200)
HDL: 34.7 mg/dL — ABNORMAL LOW (ref >39.00)
LDL Cholesterol: 160 mg/dL — ABNORMAL HIGH (ref 0–99)
NonHDL: 192.33
Total CHOL/HDL Ratio: 7
Triglycerides: 160 mg/dL — ABNORMAL HIGH (ref 0.0–149.0)
VLDL: 32 mg/dL (ref 0.0–40.0)

## 2024-01-21 LAB — CBC WITH DIFFERENTIAL/PLATELET
Basophils Absolute: 0 K/uL (ref 0.0–0.1)
Basophils Relative: 0.8 % (ref 0.0–3.0)
Eosinophils Absolute: 0.1 K/uL (ref 0.0–0.7)
Eosinophils Relative: 1.1 % (ref 0.0–5.0)
HCT: 39.7 % (ref 36.0–46.0)
Hemoglobin: 13.2 g/dL (ref 12.0–15.0)
Lymphocytes Relative: 36.3 % (ref 12.0–46.0)
Lymphs Abs: 2.1 K/uL (ref 0.7–4.0)
MCHC: 33.4 g/dL (ref 30.0–36.0)
MCV: 93.6 fl (ref 78.0–100.0)
Monocytes Absolute: 0.5 K/uL (ref 0.1–1.0)
Monocytes Relative: 9.1 % (ref 3.0–12.0)
Neutro Abs: 3 K/uL (ref 1.4–7.7)
Neutrophils Relative %: 52.7 % (ref 43.0–77.0)
Platelets: 258 K/uL (ref 150.0–400.0)
RBC: 4.24 Mil/uL (ref 3.87–5.11)
RDW: 13.1 % (ref 11.5–15.5)
WBC: 5.7 K/uL (ref 4.0–10.5)

## 2024-01-21 LAB — HEMOGLOBIN A1C: Hgb A1c MFr Bld: 5.5 % (ref 4.6–6.5)

## 2024-01-21 LAB — TSH: TSH: 4.62 u[IU]/mL (ref 0.35–5.50)

## 2024-01-21 NOTE — Progress Notes (Signed)
 New Patient Office Visit  Subjective   Patient ID: Carolyn Ramirez, female    DOB: [birth year]  Age: 33 y.o. MRN: [id number]  CC:  Chief Complaint  Patient presents with   Establish Care    HPI Carolyn Ramirez presents to establish care with new provider.   Patients previous primary care provider: [facility] with Dr. Lamar Quarry. Been years since last visit.   Specialist: Margarete SHIPPER with Twyla Barker, NP  Patient is trying to lose weight. She exercises at the gym, 3 days a week strength training, with a personal trainer 2 days a week, and trying to walk at least 10,000 steps a day. Diet: 120-140g protein, carbohydrates under 100g a day. Denies any fried foods, working on eating healthy fats. She started on 07/20 and lost 17Ibs.   Outpatient Encounter Medications as of 01/21/2024  Medication Sig   paragard intrauterine copper IUD IUD 1 each by Intrauterine route once.   [DISCONTINUED] ciprofloxacin  (CIPRO ) 500 MG tablet Take 1 tablet (500 mg total) by mouth 2 (two) times daily.   [DISCONTINUED] lamoTRIgine (LAMICTAL) 200 MG tablet Take 200 mg by mouth daily.    [DISCONTINUED] LORazepam (ATIVAN) 0.5 MG tablet Take 0.5 mg by mouth every 8 (eight) hours as needed. anxiety   [DISCONTINUED] ondansetron  (ZOFRAN  ODT) 4 MG disintegrating tablet Take 1 tablet (4 mg total) by mouth every 8 (eight) hours as needed.   No facility-administered encounter medications on file as of 01/21/2024.    Past Medical History:  Diagnosis Date   Allergy Infancy   penecillin/amoxicillin (infancy), chlorine (roughly 33 years old)   Anxiety 2008   Depression 2008    History reviewed. No pertinent surgical history.  Family History  Problem Relation Age of Onset   Anxiety disorder Mother    Cancer Mother        Francene   Hypertension Father    Other Father        Prediabetes   ADD / ADHD Brother    Heart disease Maternal Grandmother    Obesity Maternal Grandmother    Diabetes  Maternal Grandfather    Heart disease Paternal Grandmother    Diabetes Paternal Grandmother     Social History   Socioeconomic History   Marital status: Single    Spouse name: Not on file   Number of children: 0   Years of education: Not on file   Highest education level: Bachelor's degree (e.g., BA, AB, BS)  Occupational History   Not on file  Tobacco Use   Smoking status: Former    Current packs/day: 0.00    Average packs/day: 0.3 packs/day for 13.0 years (3.3 ttl pk-yrs)    Types: E-cigarettes, Cigarettes    Start date: 2012    Quit date: 10/20/2023    Years since quitting: 0.2   Smokeless tobacco: Never   Tobacco comments:    Already quit. Doing fine. Didn't check cigarettes as I was using vapes exclusively for years  Vaping Use   Vaping status: Former  Substance and Sexual Activity   Alcohol use: Yes    Comment: Very rarely. A few times per month   Drug use: Never   Sexual activity: Yes    Birth control/protection: I.U.D.  Other Topics Concern   Not on file  Social History Narrative   Not on file   Social Drivers of Health   Financial Resource Strain: Low Risk  (01/20/2024)   Overall Financial Resource Strain (CARDIA)    Difficulty  of Paying Living Expenses: Not hard at all  Food Insecurity: No Food Insecurity (01/20/2024)   Hunger Vital Sign    Worried About Running Out of Food in the Last Year: Never true    Ran Out of Food in the Last Year: Never true  Transportation Needs: No Transportation Needs (01/20/2024)   PRAPARE - Administrator, Civil Service (Medical): No    Lack of Transportation (Non-Medical): No  Physical Activity: Sufficiently Active (01/20/2024)   Exercise Vital Sign    Days of Exercise per Week: 7 days    Minutes of Exercise per Session: 120 min  Stress: Stress Concern Present (01/20/2024)   [employer] of Occupational Health - Occupational Stress Questionnaire    Feeling of Stress: To some extent  Social Connections: Moderately  Isolated (01/20/2024)   Social Connection and Isolation Panel    Frequency of Communication with Friends and Family: More than three times a week    Frequency of Social Gatherings with Friends and Family: More than three times a week    Attends Religious Services: Never    Database administrator or Organizations: Yes    Attends Engineer, structural: More than 4 times per year    Marital Status: Never married  Intimate Partner Violence: Not At Risk (01/21/2024)   Humiliation, Afraid, Rape, and Kick questionnaire    Fear of Current or Ex-Partner: No    Emotionally Abused: No    Physically Abused: No    Sexually Abused: No    ROS See HPI above    Objective  BP 116/82   Pulse 85   Temp 98.2 F (36.8 C) (Oral)   Ht 5' 4 (1.626 m)   Wt 242 lb (109.8 kg)   SpO2 99%   BMI 41.54 kg/m   Physical Exam Vitals reviewed.  Constitutional:      General: She is not in acute distress.    Appearance: Normal appearance. She is obese. She is not ill-appearing, toxic-appearing or diaphoretic.  HENT:     Head: Normocephalic and atraumatic.  Eyes:     General:        Right eye: No discharge.        Left eye: No discharge.     Conjunctiva/sclera: Conjunctivae normal.  Cardiovascular:     Rate and Rhythm: Normal rate and regular rhythm.     Heart sounds: Normal heart sounds. No murmur heard.    No friction rub. No gallop.  Pulmonary:     Effort: Pulmonary effort is normal. No respiratory distress.     Breath sounds: Normal breath sounds.  Musculoskeletal:        General: Normal range of motion.  Skin:    General: Skin is warm and dry.  Neurological:     General: No focal deficit present.     Mental Status: She is alert and oriented to person, place, and time. Mental status is at baseline.  Psychiatric:        Mood and Affect: Mood normal.        Behavior: Behavior normal.        Thought Content: Thought content normal.        Judgment: Judgment normal.      Assessment &  Plan:  Class 3 severe obesity due to excess calories without serious comorbidity with body mass index (BMI) of 40.0 to 44.9 in adult -     CBC with Differential/Platelet -     Comprehensive metabolic panel  with GFR -     Hemoglobin A1c -     Lipid panel -     TSH  Immunization due -     Flu vaccine trivalent PF, 6mos and older(Flulaval,Afluria,Fluarix,Fluzone)  Encounter to establish care   1.Review health maintenance: -Cervical cancer screening: Within the last year, request records from GYN  -Covid booster: Declines  -Tdap vaccine: [facility] on [address]- around 5 years ago -Hep B vaccine: Upload to immunization to MyChart  -Hep  C and HIV screening: Order  -HPV vaccines: Not had  -Influenza vaccine: Administer  2.Ordered labs (CBC, CMP, A1c, Lipid-not fasting, and TSH) based on BMI-obesity. Office will call with lab results and will be be available MyChart.  3.Discussed about continuing a healthy diet and participating in regular exercise. Discussed about trying intermittent fasting of 16 hours. 4.Offer a nutrition referral, but declined.  5.Follow up in 3 months for a physical and please be fasting.  Return in about 3 months (around 04/21/2024) for physical.   Briasia Flinders, NP

## 2024-01-21 NOTE — Patient Instructions (Addendum)
-  It was nice to meet you and look forward to taking care of you. -Ordered labs (CBC, CMP, A1c, Lipid-not fasting, and TSH) based on BMI-obesity. Office will call with lab results and will be be available MyChart.  -Discussed about continuing a healthy diet and participating in regular exercise. Discussed about trying intermittent fasting of 16 hours. -Provided influenza vaccine.  -Follow up in 3 months for a physical and please be fasting.

## 2024-01-28 DIAGNOSIS — Z419 Encounter for procedure for purposes other than remedying health state, unspecified: Secondary | ICD-10-CM | POA: Diagnosis not present

## 2024-02-27 DIAGNOSIS — Z419 Encounter for procedure for purposes other than remedying health state, unspecified: Secondary | ICD-10-CM | POA: Diagnosis not present

## 2024-03-29 DIAGNOSIS — Z419 Encounter for procedure for purposes other than remedying health state, unspecified: Secondary | ICD-10-CM | POA: Diagnosis not present

## 2024-04-03 ENCOUNTER — Other Ambulatory Visit (HOSPITAL_COMMUNITY)

## 2024-04-28 ENCOUNTER — Ambulatory Visit: Admitting: Family Medicine

## 2024-04-28 ENCOUNTER — Encounter: Payer: Self-pay | Admitting: Family Medicine

## 2024-04-28 ENCOUNTER — Ambulatory Visit: Payer: Self-pay | Admitting: Family Medicine

## 2024-04-28 VITALS — BP 118/74 | HR 70 | Temp 97.8°F | Ht 64.0 in | Wt 207.0 lb

## 2024-04-28 DIAGNOSIS — Z124 Encounter for screening for malignant neoplasm of cervix: Secondary | ICD-10-CM

## 2024-04-28 DIAGNOSIS — E785 Hyperlipidemia, unspecified: Secondary | ICD-10-CM

## 2024-04-28 DIAGNOSIS — Z0000 Encounter for general adult medical examination without abnormal findings: Secondary | ICD-10-CM

## 2024-04-28 LAB — LIPID PANEL
Cholesterol: 201 mg/dL — ABNORMAL HIGH (ref 0–200)
HDL: 39.5 mg/dL (ref >39.00)
LDL Cholesterol: 141 mg/dL — ABNORMAL HIGH (ref 0–99)
NonHDL: 161.23
Total CHOL/HDL Ratio: 5
Triglycerides: 100 mg/dL (ref 0.0–149.0)
VLDL: 20 mg/dL (ref 0.0–40.0)

## 2024-04-28 NOTE — Patient Instructions (Signed)
-  It was a pleasure to see you today.  -Physical exam completed. -Continue your diet and exercise. Congratulations on the weight loss!!!! -Ordered lipid panel. Office will call with lab results and will be available via MyChart.  -Placed a referral to GYN. Please call the office or send a MyChart message if you do not receive a phone call or a MyChart message about appointment in 2 weeks.  -Follow up in 1 year for a physical.

## 2024-04-28 NOTE — Progress Notes (Signed)
 Complete physical exam  Patient: Carolyn Ramirez   DOB: August 17, 1990   32 y.o. Female  MRN: [id number]  Subjective:    Chief Complaint  Patient presents with   Annual Exam    Carolyn Ramirez is a 33 y.o. female who presents today for a complete physical exam. She reports consuming a low carbohydrate, high protein, low calorie diet. Exercise: at least 10,000 steps a day; strength training 5 days a week.  She generally feels well. She reports sleeping well. She does not have additional problems to discuss today.    Most recent fall risk assessment:    04/28/2024   11:09 AM  Fall Risk   Falls in the past year? 0  Number falls in past yr: 0  Injury with Fall? 0  Risk for fall due to : No Fall Risks  Follow up Falls evaluation completed     Most recent depression screenings:    04/28/2024   11:09 AM 01/21/2024   11:18 AM  PHQ 2/9 Scores  PHQ - 2 Score 0 0  PHQ- 9 Score 0 0      Data saved with a previous flowsheet row definition    Vision:Not within last year  and Dental: No current dental problems and Receives regular dental care  Past Medical History:  Diagnosis Date   Allergy Infancy   penecillin/amoxicillin (infancy), chlorine (roughly 33 years old)   Anxiety 2008   Depression 2008   No past surgical history on file. Social History[1] Social History   Socioeconomic History   Marital status: Single    Spouse name: Not on file   Number of children: 0   Years of education: Not on file   Highest education level: Bachelor's degree (e.g., BA, AB, BS)  Occupational History   Not on file  Tobacco Use   Smoking status: Former    Current packs/day: 0.00    Average packs/day: 0.3 packs/day for 13.0 years (3.3 ttl pk-yrs)    Types: E-cigarettes, Cigarettes    Start date: 2012    Quit date: 10/20/2023    Years since quitting: 0.5   Smokeless tobacco: Never   Tobacco comments:    Already quit. Doing fine. Didn't check cigarettes as I was using vapes exclusively for  years  Vaping Use   Vaping status: Former  Substance and Sexual Activity   Alcohol use: Yes    Comment: Very rarely. A few times per month   Drug use: Never   Sexual activity: Yes    Birth control/protection: I.U.D.  Other Topics Concern   Not on file  Social History Narrative   Not on file   Social Drivers of Health   Tobacco Use: Medium Risk (04/28/2024)   Patient History    Smoking Tobacco Use: Former    Smokeless Tobacco Use: Never    Passive Exposure: Not on file  Financial Resource Strain: Low Risk (04/27/2024)   Overall Financial Resource Strain (CARDIA)    Difficulty of Paying Living Expenses: Not hard at all  Food Insecurity: No Food Insecurity (04/27/2024)   Epic    Worried About Programme Researcher, Broadcasting/film/video in the Last Year: Never true    Ran Out of Food in the Last Year: Never true  Transportation Needs: No Transportation Needs (04/27/2024)   Epic    Lack of Transportation (Medical): No    Lack of Transportation (Non-Medical): No  Physical Activity: Sufficiently Active (04/27/2024)   Exercise Vital Sign    Days of Exercise per  Week: 7 days    Minutes of Exercise per Session: 120 min  Stress: No Stress Concern Present (04/27/2024)   [employer] of Occupational Health - Occupational Stress Questionnaire    Feeling of Stress: Not at all  Social Connections: Moderately Isolated (04/27/2024)   Social Connection and Isolation Panel    Frequency of Communication with Friends and Family: Once a week    Frequency of Social Gatherings with Friends and Family: More than three times a week    Attends Religious Services: Never    Database Administrator or Organizations: Yes    Attends Engineer, Structural: More than 4 times per year    Marital Status: Never married  Intimate Partner Violence: Not At Risk (01/21/2024)   Epic    Fear of Current or Ex-Partner: No    Emotionally Abused: No    Physically Abused: No    Sexually Abused: No  Depression (PHQ2-9): Low  Risk (04/28/2024)   Depression (PHQ2-9)    PHQ-2 Score: 0  Alcohol Screen: Low Risk (04/27/2024)   Alcohol Screen    Last Alcohol Screening Score (AUDIT): 1  Housing: Low Risk (04/27/2024)   Epic    Unable to Pay for Housing in the Last Year: No    Number of Times Moved in the Last Year: 0    Homeless in the Last Year: No  Utilities: Not At Risk (01/21/2024)   Epic    Threatened with loss of utilities: No  Health Literacy: Adequate Health Literacy (01/21/2024)   B1300 Health Literacy    Frequency of need for help with medical instructions: Never   Family Status  Relation Name Status   Mother [place] Alive   Father  Alive   Brother [place] Alive   MGM Royal Deceased   MGF Zoie Sarin Deceased   PGM Merleen Pinal Deceased  No partnership data on file   Family History  Problem Relation Age of Onset   Anxiety disorder Mother    Cancer Mother        Francene   Hypertension Father    Other Father        Prediabetes   ADD / ADHD Brother    Heart disease Maternal Grandmother    Obesity Maternal Grandmother    Diabetes Maternal Grandfather    Heart disease Paternal Grandmother    Diabetes Paternal Grandmother    Allergies[2]   Patient Care Team: Billy Philippe SAUNDERS, NP as PCP - General (Family Medicine)   Show/hide medication list[3]  ROS See HPI above     Objective:   BP 118/74   Pulse 70   Temp 97.8 F (36.6 C) (Oral)   Ht 5' 4 (1.626 m)   Wt 207 lb (93.9 kg)   SpO2 98%   BMI 35.53 kg/m  Wt Readings from Last 3 Encounters:  04/28/24 207 lb (93.9 kg)  01/21/24 242 lb (109.8 kg)  09/17/22 234 lb (106.1 kg)      Physical Exam Vitals reviewed.  Constitutional:      General: She is not in acute distress.    Appearance: Normal appearance. She is obese. She is not ill-appearing, toxic-appearing or diaphoretic.  HENT:     Head: Normocephalic and atraumatic.     Right Ear: Tympanic membrane, ear canal and external ear normal. There is no impacted  cerumen.     Left Ear: Tympanic membrane, ear canal and external ear normal. There is no impacted cerumen.     Nose:  Right Sinus: No maxillary sinus tenderness or frontal sinus tenderness.     Left Sinus: No maxillary sinus tenderness or frontal sinus tenderness.     Mouth/Throat:     Mouth: Mucous membranes are moist.     Pharynx: Oropharynx is clear. No oropharyngeal exudate or posterior oropharyngeal erythema.  Eyes:     General:        Right eye: No discharge.        Left eye: No discharge.     Conjunctiva/sclera: Conjunctivae normal.     Pupils: Pupils are equal, round, and reactive to light.  Neck:     Thyroid : No thyromegaly.  Cardiovascular:     Rate and Rhythm: Normal rate and regular rhythm.     Heart sounds: Normal heart sounds. No murmur heard.    No friction rub. No gallop.  Pulmonary:     Effort: Pulmonary effort is normal. No respiratory distress.     Breath sounds: Normal breath sounds.  Abdominal:     General: Abdomen is flat. Bowel sounds are normal. There is no distension.     Palpations: Abdomen is soft. There is no mass.     Tenderness: There is no abdominal tenderness. There is no right CVA tenderness or left CVA tenderness.  Musculoskeletal:        General: Normal range of motion.     Cervical back: Normal range of motion.     Right lower leg: No edema.     Left lower leg: No edema.  Lymphadenopathy:     Head:     Right side of head: No submental or submandibular adenopathy.     Left side of head: No submental or submandibular adenopathy.     Cervical: No cervical adenopathy.  Skin:    General: Skin is warm and dry.  Neurological:     General: No focal deficit present.     Mental Status: She is alert and oriented to person, place, and time. Mental status is at baseline.     Motor: No weakness.     Gait: Gait normal.  Psychiatric:        Mood and Affect: Mood normal.        Behavior: Behavior normal.        Thought Content: Thought content  normal.        Judgment: Judgment normal.        Assessment & Plan:    Routine Health Maintenance and Physical Exam  Immunization History  Administered Date(s) Administered   Fluzone Influenza virus vaccine,trivalent (IIV3), split virus 01/27/2021   Influenza, Seasonal, Injecte, Preservative Fre 01/21/2024   Influenza,inj,Quad PF,6+ Mos 06/30/2018, 05/16/2020   Moderna Covid-19 Vaccine Bivalent Booster 61yrs & up 01/27/2021   PFIZER(Purple Top)SARS-COV-2 Vaccination 08/04/2019, 08/25/2019   Rabies, IM 09/17/2022, 09/20/2022, 09/24/2022, 10/01/2022   Td (Adult),5 Lf Tetanus Toxid, Preservative Free 06/30/2018    Health Maintenance  Topic Date Due   HIV Screening  Never done   Hepatitis C Screening  Never done   Hepatitis B Vaccines 19-59 Average Risk (1 of 3 - 19+ 3-dose series) Never done   HPV VACCINES (1 - 3-dose SCDM series) Never done   DTaP/Tdap/Td (1 - Tdap) 07/01/2018   Cervical Cancer Screening (HPV/Pap Cotest)  12/31/2020   COVID-19 Vaccine (4 - 2025-26 season) 01/17/2024   Influenza Vaccine  Completed   Pneumococcal Vaccine  Aged Out   Meningococcal B Vaccine  Aged Out    Discussed health benefits of physical activity, and  encouraged her to engage in regular exercise appropriate for her age and condition.  Annual physical exam -     Lipid panel  Cervical cancer screening -     Ambulatory referral to Gynecology  Hyperlipidemia, unspecified hyperlipidemia type -     Lipid panel  1.Review health maintenance:  -Cervical cancer screening: [facility] 2024, placed a referral to establish with new provider since previous provider left  -Covid booster: Declines  -HIV and Hep C screening: Done with [facility]  -Hep B vaccine: Probably had them  -HPV vaccine: Not had them  2.Physical exam completed. 3.Continue diet and exercise. Congratulating on the weight loss!!!! 4.Ordered lipid panel. Office will call with lab results and will be available via MyChart. All other  labs were stable in October.  Return in about 1 year (around 04/28/2025) for physical.     Deetya Drouillard, NP     [1]  Social History Tobacco Use   Smoking status: Former    Current packs/day: 0.00    Average packs/day: 0.3 packs/day for 13.0 years (3.3 ttl pk-yrs)    Types: E-cigarettes, Cigarettes    Start date: 2012    Quit date: 10/20/2023    Years since quitting: 0.5   Smokeless tobacco: Never   Tobacco comments:    Already quit. Doing fine. Didn't check cigarettes as I was using vapes exclusively for years  Vaping Use   Vaping status: Former  Substance Use Topics   Alcohol use: Yes    Comment: Very rarely. A few times per month   Drug use: Never  [2]  Allergies Allergen Reactions   Amoxicillin Other (See Comments)    Unknown childhood allergy   Chlorine Hives   Penicillins Other (See Comments)    Unknown childhood allergy  [3]  Outpatient Medications Prior to Visit  Medication Sig   paragard intrauterine copper IUD IUD 1 each by Intrauterine route once.   No facility-administered medications prior to visit.

## 2024-06-09 ENCOUNTER — Other Ambulatory Visit (HOSPITAL_COMMUNITY)

## 2024-07-28 ENCOUNTER — Other Ambulatory Visit (HOSPITAL_COMMUNITY)
